# Patient Record
Sex: Female | Born: 1955 | Race: Black or African American | Hispanic: No | Marital: Married | Smoking: Never smoker
Health system: Southern US, Community
[De-identification: ages and names within clinical notes are randomized; demographics above are authoritative.]

## PROBLEM LIST (undated history)

## (undated) ENCOUNTER — Ambulatory Visit: Admission: EM | Disposition: A | Discharge status: Home / Self Care | Payer: Medicare Other

---

## 2004-10-12 ENCOUNTER — Ambulatory Visit: Payer: Self-pay | Admitting: General Practice

## 2005-04-17 ENCOUNTER — Ambulatory Visit: Payer: Self-pay | Admitting: Unknown Physician Specialty

## 2005-10-24 ENCOUNTER — Ambulatory Visit: Payer: Self-pay | Admitting: General Practice

## 2006-12-06 ENCOUNTER — Ambulatory Visit: Payer: Self-pay | Admitting: Endocrinology

## 2007-07-26 ENCOUNTER — Ambulatory Visit: Payer: Self-pay | Admitting: Rheumatology

## 2007-07-30 ENCOUNTER — Ambulatory Visit: Payer: Self-pay | Admitting: Rheumatology

## 2007-08-16 ENCOUNTER — Ambulatory Visit: Payer: Self-pay | Admitting: Orthopaedic Surgery

## 2007-08-23 ENCOUNTER — Ambulatory Visit: Payer: Self-pay | Admitting: Orthopaedic Surgery

## 2007-12-12 ENCOUNTER — Ambulatory Visit: Payer: Self-pay | Admitting: Anesthesiology

## 2008-03-30 ENCOUNTER — Ambulatory Visit: Payer: Self-pay | Admitting: Otolaryngology

## 2008-10-09 DIAGNOSIS — E7800 Pure hypercholesterolemia, unspecified: Secondary | ICD-10-CM | POA: Insufficient documentation

## 2008-11-09 DIAGNOSIS — M1710 Unilateral primary osteoarthritis, unspecified knee: Secondary | ICD-10-CM | POA: Insufficient documentation

## 2009-03-31 ENCOUNTER — Ambulatory Visit: Payer: Self-pay

## 2009-04-05 DIAGNOSIS — I10 Essential (primary) hypertension: Secondary | ICD-10-CM | POA: Insufficient documentation

## 2010-06-16 IMAGING — US US THYROID
1 series · 17 of 25 positions shown · non-contrast
Comparison: none

REASON FOR EXAM: Thyroid nodules, hypothyroidism
COMMENTS:

[Series 1: us thyroid · 17 of 33 slices shown]
[im 1/33]
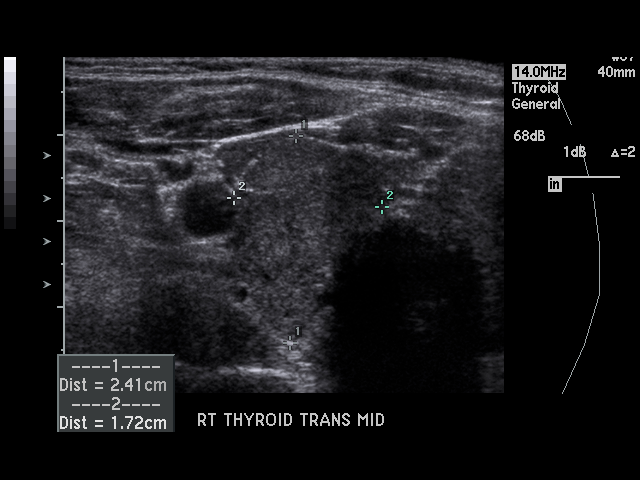
[im 3/33]
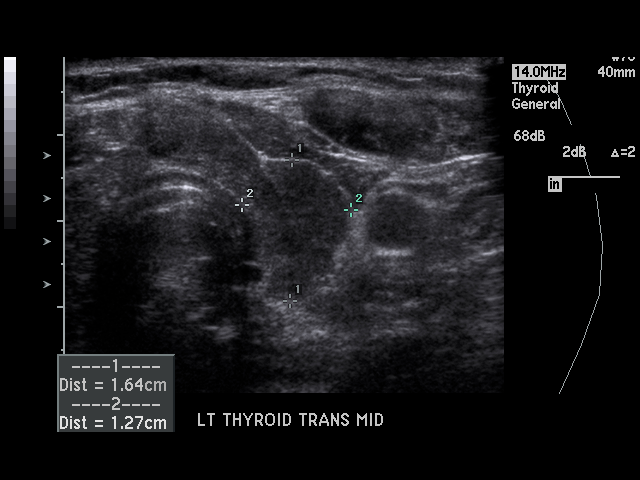
[im 5/33]
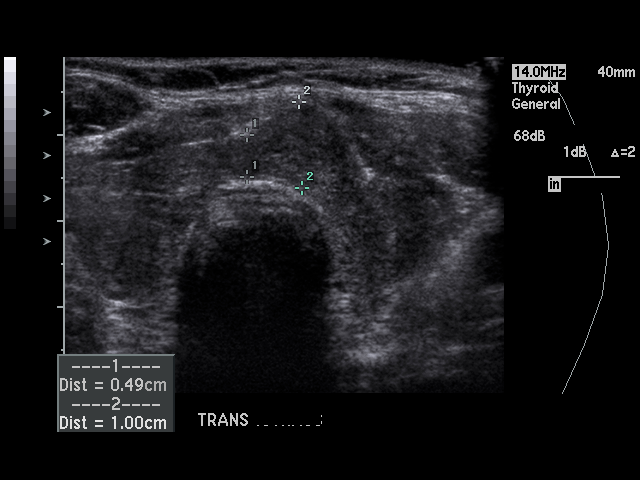
[im 7/33]
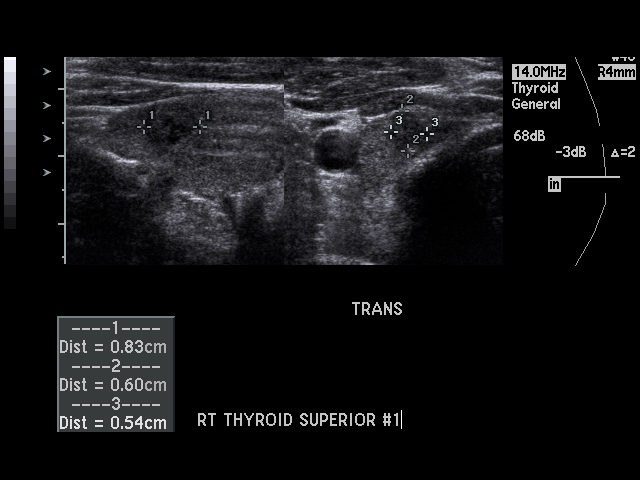
[im 9/33]
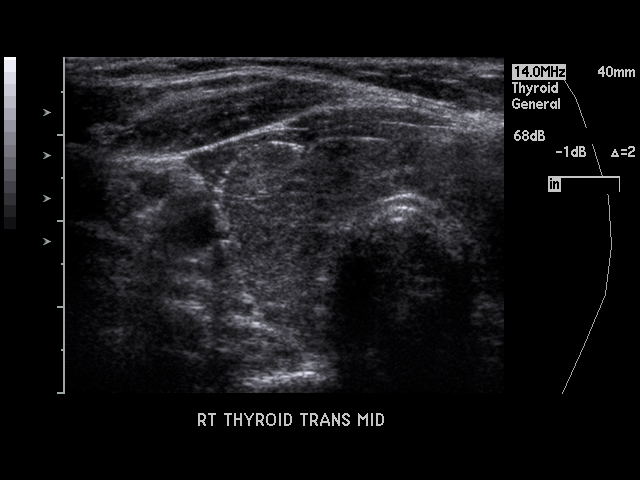
[im 11/33]
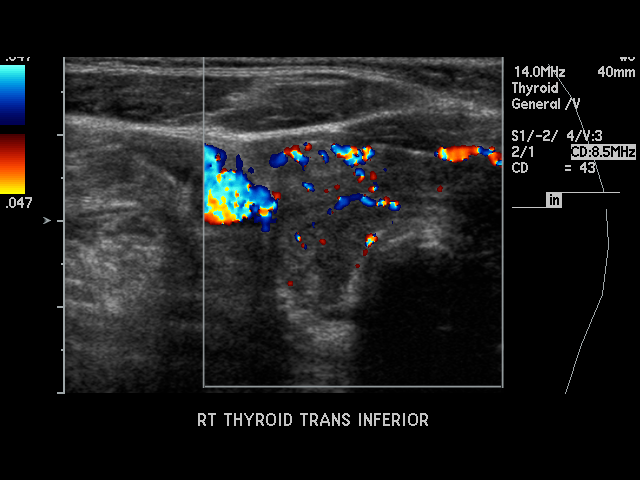
[im 13/33]
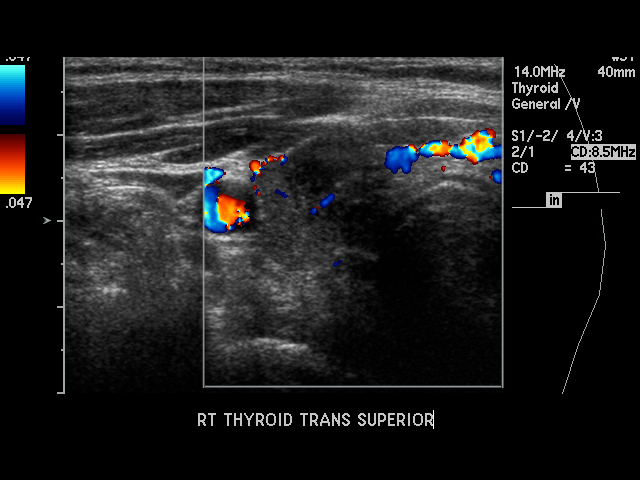
[im 15/33]
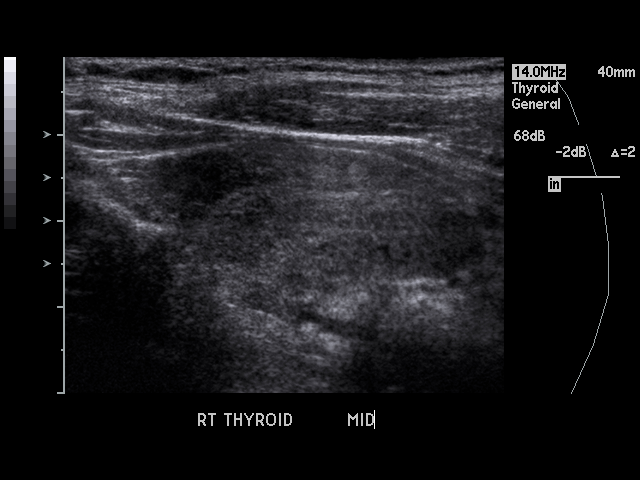
[im 17/33]
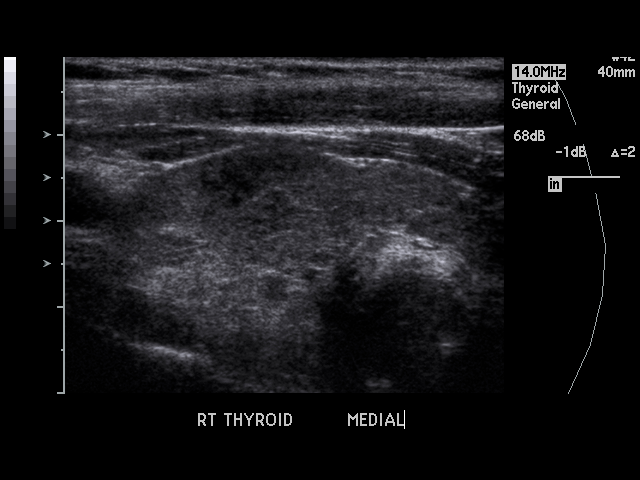
[im 18/33]
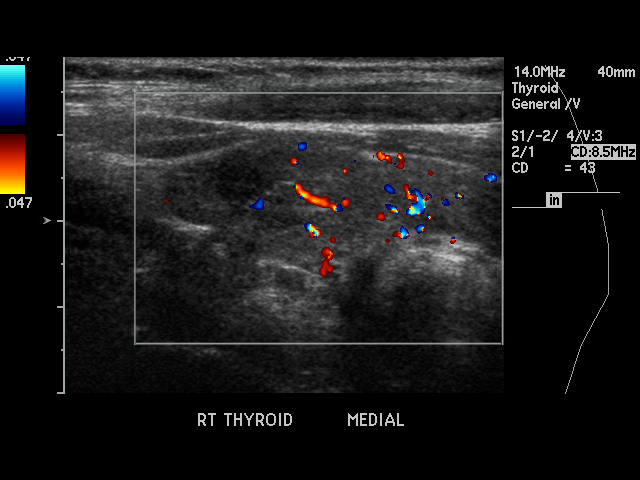
[im 21/33]
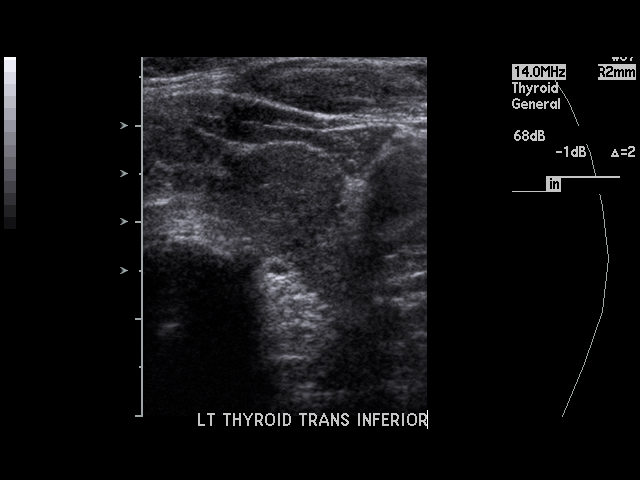
[im 22/33]
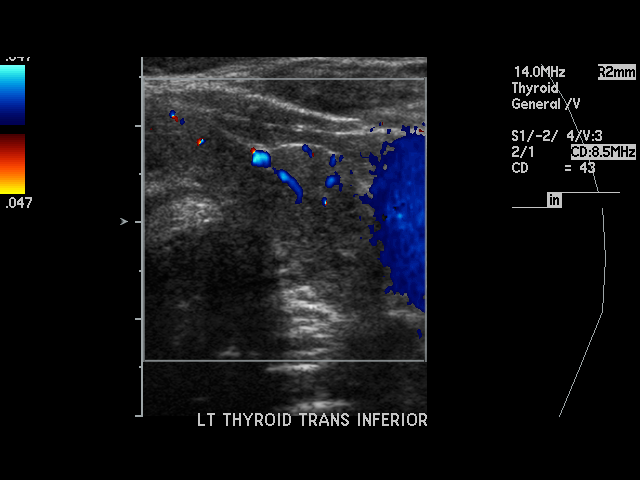
[im 25/33]
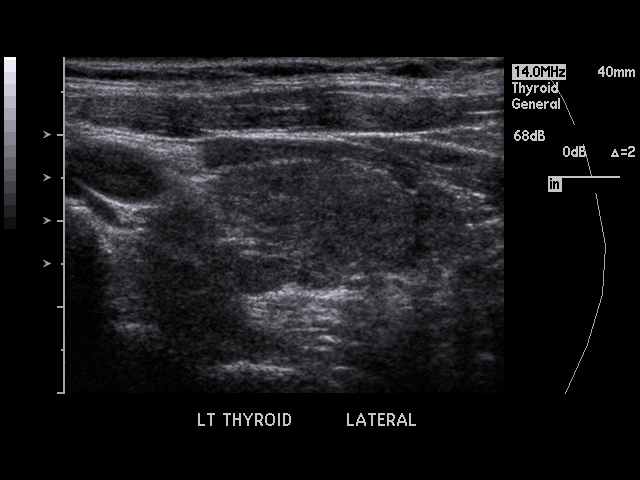
[im 26/33]
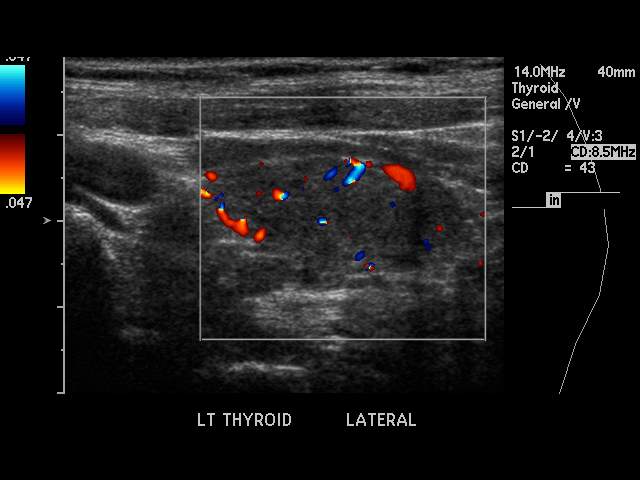
[im 29/33]
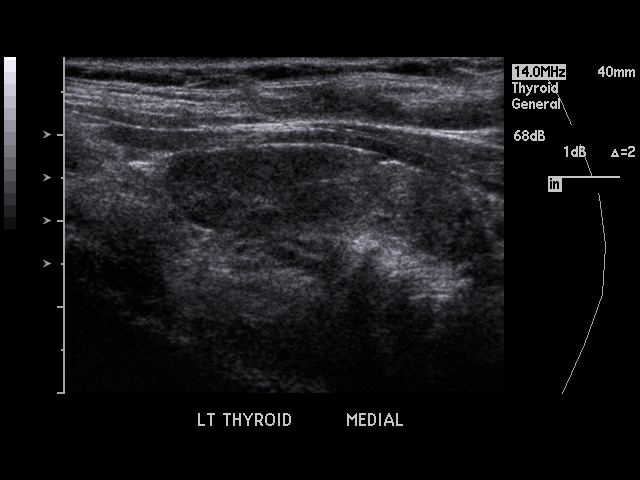
[im 30/33]
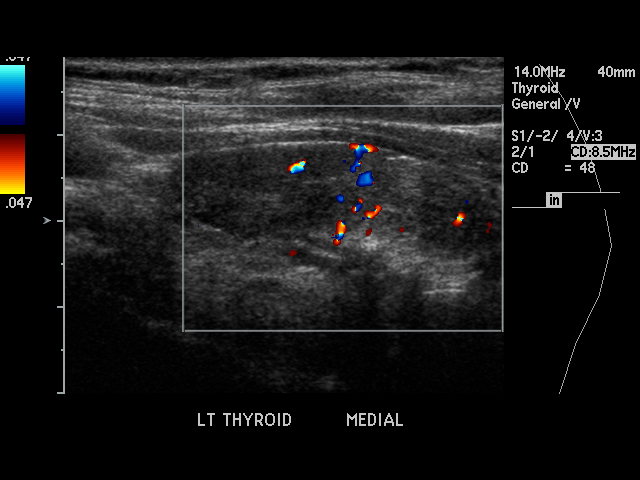
[im 33/33]
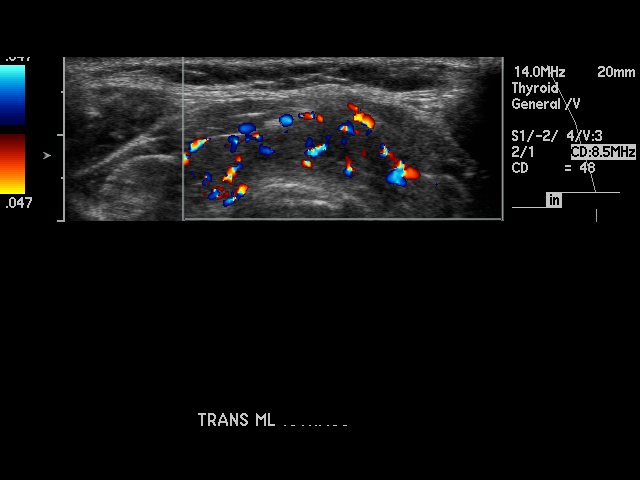

[17 of 25 positions shown; findings below may reference images not displayed]

PROCEDURE:     US  - US THYROID  - March 30, 2008 [DATE]

RESULT:     The RIGHT lobe of the thyroid measures 3.86 cm x 1.72 cm x
cm. The LEFT lobe measures 3.95 cm x 1.27 cm x 1.64 cm. There is an 8.3 mm,
hypoechoic nodule at the upper pole of the RIGHT lobe. No associated
calcifications are seen. No focal nodules are identified in the LEFT lobe.
In the lateral aspect of the isthmus on the LEFT there is noted thickening
of the isthmus which increases from approximately 5.0 mm in AP diameter to
1.0 cm in AP diameter. The echo pattern is similar to that of the thyroid.
No definite focal mass is seen. The lobes of the thyroid bilaterally have a
mildly heterogeneous echotexture.
IMPRESSION: 1.  There is 8.3 mm, hypoechoic nodule in the RIGHT lobe.
2.  No definite nodules are identified on the LEFT. There is noted
thickening of the isthmus on the LEFT but a definite mass is not seen.
3.  The thyroid echotexture bilaterally is mildly heterogeneous.

## 2010-06-21 ENCOUNTER — Ambulatory Visit: Payer: Self-pay

## 2011-08-25 ENCOUNTER — Ambulatory Visit: Payer: Self-pay

## 2012-12-04 ENCOUNTER — Emergency Department: Payer: Self-pay | Admitting: Emergency Medicine

## 2012-12-04 LAB — COMPREHENSIVE METABOLIC PANEL
Alkaline Phosphatase: 91 U/L (ref 50–136)
Anion Gap: 5 — ABNORMAL LOW (ref 7–16)
BUN: 14 mg/dL (ref 7–18)
Bilirubin,Total: 0.5 mg/dL (ref 0.2–1.0)
Chloride: 97 mmol/L — ABNORMAL LOW (ref 98–107)
Co2: 34 mmol/L — ABNORMAL HIGH (ref 21–32)
Creatinine: 0.77 mg/dL (ref 0.60–1.30)
EGFR (African American): >60
EGFR (Non-African Amer.): >60
Potassium: 2.9 mmol/L — ABNORMAL LOW (ref 3.5–5.1)
SGOT(AST): 24 U/L (ref 15–37)
Sodium: 136 mmol/L (ref 136–145)
Total Protein: 8.5 g/dL — ABNORMAL HIGH (ref 6.4–8.2)

## 2012-12-04 LAB — URINALYSIS, COMPLETE
Blood: NEGATIVE
Ketone: NEGATIVE
Nitrite: NEGATIVE
Ph: 9 (ref 4.5–8.0)
Protein: 30
RBC,UR: <1 /HPF (ref 0–5)
Squamous Epithelial: 3
WBC UR: 1 /HPF (ref 0–5)

## 2012-12-04 LAB — CBC
MCH: 30.7 pg (ref 26.0–34.0)
MCV: 89 fL (ref 80–100)
Platelet: 506 10*3/uL — ABNORMAL HIGH (ref 150–440)
RDW: 13.3 % (ref 11.5–14.5)
WBC: 7.4 10*3/uL (ref 3.6–11.0)

## 2014-02-20 DIAGNOSIS — Z0000 Encounter for general adult medical examination without abnormal findings: Secondary | ICD-10-CM | POA: Insufficient documentation

## 2014-07-31 ENCOUNTER — Ambulatory Visit
Admit: 2014-07-31 | Disposition: A | Discharge status: Home / Self Care | Payer: Self-pay | Attending: Family Medicine | Admitting: Family Medicine

## 2014-11-25 DIAGNOSIS — E559 Vitamin D deficiency, unspecified: Secondary | ICD-10-CM | POA: Insufficient documentation

## 2015-11-19 DIAGNOSIS — M545 Low back pain, unspecified: Secondary | ICD-10-CM | POA: Insufficient documentation

## 2018-10-22 DIAGNOSIS — E039 Hypothyroidism, unspecified: Secondary | ICD-10-CM | POA: Insufficient documentation

## 2019-07-15 DIAGNOSIS — R5383 Other fatigue: Secondary | ICD-10-CM | POA: Insufficient documentation

## 2019-10-16 ENCOUNTER — Ambulatory Visit: Payer: Self-pay | Admitting: Dermatology

## 2020-01-01 DIAGNOSIS — R413 Other amnesia: Secondary | ICD-10-CM | POA: Insufficient documentation

## 2020-04-05 ENCOUNTER — Ambulatory Visit (INDEPENDENT_AMBULATORY_CARE_PROVIDER_SITE_OTHER): Payer: Medicare Other | Admitting: Dermatology

## 2020-04-05 ENCOUNTER — Other Ambulatory Visit: Payer: Self-pay

## 2020-04-05 DIAGNOSIS — L821 Other seborrheic keratosis: Secondary | ICD-10-CM | POA: Diagnosis not present

## 2020-04-05 DIAGNOSIS — L820 Inflamed seborrheic keratosis: Secondary | ICD-10-CM

## 2020-04-05 DIAGNOSIS — D229 Melanocytic nevi, unspecified: Secondary | ICD-10-CM

## 2020-04-05 DIAGNOSIS — D225 Melanocytic nevi of trunk: Secondary | ICD-10-CM | POA: Diagnosis not present

## 2020-04-05 MED ORDER — UREA 39 % EX CREA: TOPICAL_CREAM | CUTANEOUS | 0 refills | Status: AC

## 2020-04-05 NOTE — Progress Notes (Signed)
   Follow-Up Visit   Subjective  Felicia Adams is a 64 y.o. female who presents for the following: multiple lesions to be checked (on the R heel, back, and L neck - irregular appearing ) and ISK (Treated previously with LN2 - patient would like lesion checked for recurrence).  The following portions of the chart were reviewed this encounter and updated as appropriate:   Allergies  Meds  Problems  Med Hx  Surg Hx  Fam Hx     Review of Systems:  No other skin or systemic complaints except as noted in HPI or Assessment and Plan.  Objective  Well appearing patient in no apparent distress; mood and affect are within normal limits.  A focused examination was performed including the trunk and extremities. Relevant physical exam findings are noted in the Assessment and Plan.  Objective  L clavicle: Flesh colored papule   Objective  Face: Stuck-on, waxy, tan-brown papule or plaque --Discussed benign etiology and prognosis.   Objective  L waist: Erythematous keratotic or waxy stuck-on papule or plaque.   Assessment & Plan  Nevus L clavicle Benign appearing, observe.  Seborrheic keratosis Face Multiple, confluent Start Urea cream apply to aa's face QD.   Urea 39 % CREA - Face  Inflamed seborrheic keratosis L waist Discussed hypopigmentation after tx with Ln2.   Destruction of lesion - L waist Complexity: simple   Destruction method: cryotherapy   Informed consent: discussed and consent obtained   Timeout:  patient name, date of birth, surgical site, and procedure verified Lesion destroyed using liquid nitrogen: Yes   Region frozen until ice ball extended beyond lesion: Yes   Outcome: patient tolerated procedure well with no complications   Post-procedure details: wound care instructions given     Seborrheic Keratoses - Stuck-on, waxy, tan-brown papules and plaques  - Discussed benign etiology and prognosis. - Observe - Call for any changes - Discussed urea  cream for the SK's on the face  Return if symptoms worsen or fail to improve - patient RTC if ISK not resolved in 2 months.  Luther Redo, CMA, am acting as scribe for Sarina Ser, MD .  Documentation: I have reviewed the above documentation for accuracy and completeness, and I agree with the above.  Sarina Ser, MD

## 2020-04-08 ENCOUNTER — Encounter: Payer: Self-pay | Admitting: Dermatology

## 2020-11-16 DIAGNOSIS — J302 Other seasonal allergic rhinitis: Secondary | ICD-10-CM | POA: Insufficient documentation

## 2021-06-12 DIAGNOSIS — R2242 Localized swelling, mass and lump, left lower limb: Secondary | ICD-10-CM | POA: Insufficient documentation

## 2022-02-23 DIAGNOSIS — M79671 Pain in right foot: Secondary | ICD-10-CM | POA: Insufficient documentation

## 2022-02-23 DIAGNOSIS — S0033XA Contusion of nose, initial encounter: Secondary | ICD-10-CM | POA: Insufficient documentation

## 2022-06-09 DIAGNOSIS — R0981 Nasal congestion: Secondary | ICD-10-CM | POA: Insufficient documentation

## 2022-07-21 DIAGNOSIS — J3489 Other specified disorders of nose and nasal sinuses: Secondary | ICD-10-CM | POA: Insufficient documentation

## 2022-12-01 DIAGNOSIS — J310 Chronic rhinitis: Secondary | ICD-10-CM | POA: Insufficient documentation

## 2022-12-26 ENCOUNTER — Ambulatory Visit
Admission: EM | Admit: 2022-12-26 | Discharge: 2022-12-26 | Disposition: A | Discharge status: Home / Self Care | Payer: Medicare Other | Attending: Family Medicine | Admitting: Family Medicine

## 2022-12-26 DIAGNOSIS — Z20822 Contact with and (suspected) exposure to covid-19: Secondary | ICD-10-CM | POA: Diagnosis not present

## 2022-12-26 DIAGNOSIS — Z1152 Encounter for screening for COVID-19: Secondary | ICD-10-CM | POA: Insufficient documentation

## 2022-12-26 DIAGNOSIS — B9789 Other viral agents as the cause of diseases classified elsewhere: Secondary | ICD-10-CM | POA: Insufficient documentation

## 2022-12-26 DIAGNOSIS — J22 Unspecified acute lower respiratory infection: Secondary | ICD-10-CM | POA: Diagnosis present

## 2022-12-26 MED ORDER — PROMETHAZINE-DM 6.25-15 MG/5ML PO SYRP: 5.0000 mL | ORAL_SOLUTION | Freq: Three times a day (TID) | ORAL | 0 refills | Status: DC | PRN

## 2022-12-26 MED ORDER — ALBUTEROL SULFATE HFA 108 (90 BASE) MCG/ACT IN AERS
2.0000 | INHALATION_SPRAY | Freq: Four times a day (QID) | RESPIRATORY_TRACT | Status: DC
  Administered 2022-12-26: 2 via RESPIRATORY_TRACT

## 2022-12-26 MED ORDER — ALBUTEROL SULFATE HFA 108 (90 BASE) MCG/ACT IN AERS: 2.0000 | INHALATION_SPRAY | Freq: Four times a day (QID) | RESPIRATORY_TRACT | Status: AC | PRN

## 2022-12-26 MED ORDER — PREDNISONE 20 MG PO TABS: 20.0000 mg | ORAL_TABLET | Freq: Every day | ORAL | 0 refills | Status: AC

## 2022-12-26 NOTE — Discharge Instructions (Signed)
COVID test results will be available within 24 hours.  You can either activate your MyChart or call our office after 2:00 tomorrow p.m. to dad about results.  In the meantime continue treatment as prescribed.  Your COVID test is positive you will qualify for Paxlovid antivirals.  Medication can shorten the course of COVID along with decrease complications associated with COVID infections.

## 2022-12-26 NOTE — ED Provider Notes (Signed)
[facility]    CSN: [id number] Arrival date & time: 12/26/22  0802      History   Chief Complaint Chief Complaint  Patient presents with   Cough   Headache    HPI Felicia Adams is a 67 y.o. female, patient presents today with cough, congestion, headaches, and generally feeling unwell. She has not had fever. Endorses back pain in middle of back with breathing. Wears mask however, unknown of any sick contacts. Patient has a history chronic rhinitis and and seasonal allergies and takes medications for prevention of these symptoms. Patient denies shortness of breath, wheezing, or chest tightness or pain.   History reviewed. No pertinent past medical history.  Patient Active Problem List   Diagnosis Date Noted   Chronic rhinitis 12/01/2022   Nasal obstruction 07/21/2022   Chronic nasal congestion 06/09/2022   Foot pain, right 02/23/2022   Traumatic ecchymosis of nose 02/23/2022   Mass of left thigh 06/12/2021   Seasonal allergies 11/16/2020   Memory change 01/01/2020   Fatigue 07/15/2019   Hypothyroidism (acquired) 10/22/2018   Low back pain 11/19/2015   Vitamin D deficiency 11/25/2014   Preventative health care 02/20/2014   Hypertension, benign 04/05/2009   Primary localized osteoarthrosis, lower leg 11/09/2008   Hypercholesterolemia 10/09/2008    History reviewed. No pertinent surgical history.  OB History   No obstetric history on file.      Home Medications    Prior to Admission medications   Medication Sig Start Date End Date Taking? Authorizing Provider  albuterol (VENTOLIN HFA) 108 (90 Base) MCG/ACT inhaler Inhale 2 puffs into the lungs every 6 (six) hours as needed for wheezing or shortness of breath. 12/26/22  Yes Bing Neighbors, NP  mirtazapine (REMERON) 15 MG tablet Take by mouth. 04/09/22 04/09/23 Yes [provider]  predniSONE (DELTASONE) 20 MG tablet Take 1 tablet (20 mg total) by mouth daily with breakfast for 5 days.  12/26/22 12/31/22 Yes Bing Neighbors, NP  promethazine-dextromethorphan (PROMETHAZINE-DM) 6.25-15 MG/5ML syrup Take 5 mLs by mouth 3 (three) times daily as needed for cough. 12/26/22  Yes Bing Neighbors, NP  sertraline (ZOLOFT) 100 MG tablet Take 1 tablet by mouth daily. 08/25/22  Yes [provider]  traMADol (ULTRAM) 50 MG tablet Take 50 mg by mouth every 6 (six) hours as needed. 09/19/22  Yes [provider]  Cholecalciferol 50 MCG (2000 UT) TABS Take by mouth. 09/25/19   [provider]  cyanocobalamin (VITAMIN B12) 1000 MCG tablet Take 1 tablet by mouth daily.    [provider]  fluticasone (FLONASE) 50 MCG/ACT nasal spray 2 sprays by Each Nare route daily. 02/25/19   [provider]  hydrochlorothiazide (HYDRODIURIL) 25 MG tablet TAKE ONE TABLET BYM OUTH ONCE DAILY IN THE MORNING 02/18/19   [provider]  meloxicam (MOBIC) 15 MG tablet Take by mouth. 09/03/19   [provider]  montelukast (SINGULAIR) 10 MG tablet Take 10 mg by mouth daily.    [provider]  rosuvastatin (CRESTOR) 40 MG tablet Take 1 tablet by mouth daily. 03/05/20   [provider]  Urea 39 % CREA Apply to aa's face QD PRN. 04/05/20   Deirdre Evener, MD    Family History History reviewed. No pertinent family history.  Social History Social History   Tobacco Use   Smoking status: Never   Smokeless tobacco: Never  Vaping Use   Vaping status: Never Used  Substance Use Topics   Alcohol  use: Never   Drug use: Never     Allergies   Oxycodone   Review of Systems Review of Systems  Respiratory:  Positive for cough.   Neurological:  Positive for headaches.     Physical Exam Triage Vital Signs ED Triage Vitals [12/26/22 0819]  Encounter Vitals Group     BP (!) 158/88     Systolic BP Percentile      Diastolic BP Percentile      Pulse Rate 67     Resp 18     Temp 98.4 F (36.9 C)     Temp Source Oral     SpO2 96 %      Weight      Height      Head Circumference      Peak Flow      Pain Score      Pain Loc      Pain Education      Exclude from Growth Chart    No data found.  Updated Vital Signs BP (!) 158/88 (BP Location: Left Arm)   Pulse 67   Temp 98.4 F (36.9 C) (Oral)   Resp 18   SpO2 96%   Visual Acuity Right Eye Distance:   Left Eye Distance:   Bilateral Distance:    Right Eye Near:   Left Eye Near:    Bilateral Near:     Physical Exam Vitals reviewed.  Constitutional:      Appearance: She is well-developed.  HENT:     Head: Normocephalic and atraumatic.     Nose: Mucosal edema, congestion and rhinorrhea present.  Eyes:     Extraocular Movements: Extraocular movements intact.     Pupils: Pupils are equal, round, and reactive to light.  Cardiovascular:     Rate and Rhythm: Normal rate and regular rhythm.  Pulmonary:     Effort: Pulmonary effort is normal.     Breath sounds: Rhonchi present.  Musculoskeletal:        General: Normal range of motion.  Skin:    General: Skin is warm and dry.  Neurological:     Mental Status: She is alert.      UC Treatments / Results  Labs (all labs ordered are listed, but only abnormal results are displayed) Labs Reviewed  SARS CORONAVIRUS 2 (TAT 6-24 HRS)    EKG   Radiology No results found.  Procedures Procedures (including critical care time)  Medications Ordered in UC Medications  albuterol (VENTOLIN HFA) 108 (90 Base) MCG/ACT inhaler 2 puff (has no administration in time range)    Initial Impression / Assessment and Plan / UC Course  I have reviewed the triage vital signs and the nursing notes.  Pertinent labs & imaging results that were available during my care of the patient were reviewed by me and considered in my medical decision making (see chart for details).    Viral illness, obtained a COVID PCR test to rule out COVID as a source of symptoms.  Will treat with prednisone 20 mg once daily for 5 days.   Also given notable rhonchi and chest will cover with prednisone 20 mg daily for 5 days to relieve chest congestion as this is concerning for possibly evolving bronchitis.  Promethazine DM for cough.  Patient advised that if COVID test is positive she qualifies for antivirals.  Patient has had recent blood work and GFR is greater than 60. Final Clinical Impressions(s) / UC Diagnoses   Final diagnoses:  Encounter  for laboratory testing for COVID-19 virus  Viral infection of lower respiratory system     Discharge Instructions      COVID test results will be available within 24 hours.  You can either activate your MyChart or call our office after 2:00 tomorrow p.m. to dad about results.  In the meantime continue treatment as prescribed.  Your COVID test is positive you will qualify for Paxlovid antivirals.  Medication can shorten the course of COVID along with decrease complications associated with COVID infections.     ED Prescriptions     Medication Sig Dispense Auth. Provider   predniSONE (DELTASONE) 20 MG tablet Take 1 tablet (20 mg total) by mouth daily with breakfast for 5 days. 5 tablet Bing Neighbors, NP   promethazine-dextromethorphan (PROMETHAZINE-DM) 6.25-15 MG/5ML syrup Take 5 mLs by mouth 3 (three) times daily as needed for cough. 180 mL Bing Neighbors, NP   albuterol (VENTOLIN HFA) 108 (90 Base) MCG/ACT inhaler Inhale 2 puffs into the lungs every 6 (six) hours as needed for wheezing or shortness of breath. -- Bing Neighbors, NP      PDMP not reviewed this encounter.   Bing Neighbors, NP 12/26/22 [phone]

## 2022-12-26 NOTE — ED Triage Notes (Signed)
Patient presents to UC for HA and productive cough since yesterday. Not taking any OTC meds for symptoms.

## 2022-12-27 LAB — SARS CORONAVIRUS 2 (TAT 6-24 HRS): SARS Coronavirus 2: NEGATIVE

## 2022-12-28 ENCOUNTER — Telehealth: Payer: Self-pay

## 2022-12-28 NOTE — Telephone Encounter (Signed)
Patient contacted Urgent Care for Covid testing results.   Provided with results; no further questions.

## 2023-03-02 ENCOUNTER — Emergency Department
Admission: EM | Admit: 2023-03-02 | Discharge: 2023-03-02 | Disposition: A | Discharge status: Against Medical Advice | Payer: Medicare Other | Attending: Emergency Medicine | Admitting: Emergency Medicine

## 2023-03-02 ENCOUNTER — Other Ambulatory Visit: Payer: Self-pay

## 2023-03-02 ENCOUNTER — Ambulatory Visit
Admission: EM | Admit: 2023-03-02 | Discharge: 2023-03-02 | Disposition: A | Discharge status: Home / Self Care | Payer: Medicare Other | Source: Home / Self Care | Attending: Family Medicine | Admitting: Family Medicine

## 2023-03-02 DIAGNOSIS — M6281 Muscle weakness (generalized): Secondary | ICD-10-CM | POA: Insufficient documentation

## 2023-03-02 DIAGNOSIS — Z1152 Encounter for screening for COVID-19: Secondary | ICD-10-CM | POA: Insufficient documentation

## 2023-03-02 DIAGNOSIS — K219 Gastro-esophageal reflux disease without esophagitis: Secondary | ICD-10-CM | POA: Insufficient documentation

## 2023-03-02 DIAGNOSIS — R42 Dizziness and giddiness: Secondary | ICD-10-CM

## 2023-03-02 DIAGNOSIS — Z5321 Procedure and treatment not carried out due to patient leaving prior to being seen by health care provider: Secondary | ICD-10-CM | POA: Insufficient documentation

## 2023-03-02 DIAGNOSIS — D649 Anemia, unspecified: Secondary | ICD-10-CM | POA: Insufficient documentation

## 2023-03-02 DIAGNOSIS — I10 Essential (primary) hypertension: Secondary | ICD-10-CM | POA: Insufficient documentation

## 2023-03-02 DIAGNOSIS — R9431 Abnormal electrocardiogram [ECG] [EKG]: Secondary | ICD-10-CM | POA: Insufficient documentation

## 2023-03-02 DIAGNOSIS — R112 Nausea with vomiting, unspecified: Secondary | ICD-10-CM | POA: Insufficient documentation

## 2023-03-02 DIAGNOSIS — E785 Hyperlipidemia, unspecified: Secondary | ICD-10-CM | POA: Insufficient documentation

## 2023-03-02 LAB — URINALYSIS, ROUTINE W REFLEX MICROSCOPIC
Bilirubin Urine: NEGATIVE
Glucose, UA: NEGATIVE mg/dL
Hgb urine dipstick: NEGATIVE
Ketones, ur: NEGATIVE mg/dL
Leukocytes,Ua: NEGATIVE
Nitrite: NEGATIVE
Protein, ur: NEGATIVE mg/dL
Specific Gravity, Urine: 1.018 (ref 1.005–1.030)
pH: 5 (ref 5.0–8.0)

## 2023-03-02 LAB — CBC
HCT: 40.7 % (ref 36.0–46.0)
Hemoglobin: 13.1 g/dL (ref 12.0–15.0)
MCH: 29.4 pg (ref 26.0–34.0)
MCHC: 32.2 g/dL (ref 30.0–36.0)
MCV: 91.3 fL (ref 80.0–100.0)
Platelets: 272 10*3/uL (ref 150–400)
RBC: 4.46 MIL/uL (ref 3.87–5.11)
RDW: 13.2 % (ref 11.5–15.5)
WBC: 7.8 10*3/uL (ref 4.0–10.5)
nRBC: 0 % (ref 0.0–0.2)

## 2023-03-02 LAB — BASIC METABOLIC PANEL
Anion gap: 11 (ref 5–15)
BUN: 16 mg/dL (ref 8–23)
CO2: 26 mmol/L (ref 22–32)
Calcium: 9.6 mg/dL (ref 8.9–10.3)
Chloride: 99 mmol/L (ref 98–111)
Creatinine, Ser: 0.84 mg/dL (ref 0.44–1.00)
GFR, Estimated: >60 mL/min (ref >60)
Glucose, Bld: 151 mg/dL — ABNORMAL HIGH (ref 70–99)
Potassium: 3.6 mmol/L (ref 3.5–5.1)
Sodium: 136 mmol/L (ref 135–145)

## 2023-03-02 LAB — SARS CORONAVIRUS 2 BY RT PCR: SARS Coronavirus 2 by RT PCR: NEGATIVE

## 2023-03-02 NOTE — ED Provider Notes (Signed)
[facility]    CSN: [id number] Arrival date & time: 03/02/23  1726      History   Chief Complaint Chief Complaint  Patient presents with   Dizziness    HPI VIANEY CANIGLIA is a 67 y.o. female.   HPI   Ashritha presents for dizziness started this morning after waking up and rolling over. No headache, chest pain, fever, leg swelling.  Had dizziness years ago but is unsure what it was for. Daughter states her mom has vertigo.    Told her blood pressure medications today.    Fever : no  Chills: no Sore throat: no   Cough: no Sputum: no Nasal congestion : no  Rhinorrhea: no Myalgias: no Appetite: normal  Hydration: normal  Abdominal pain: no Nausea: no Vomiting: no Sleep disturbance: no Headache: no      History reviewed. No pertinent past medical history.  Patient Active Problem List   Diagnosis Date Noted   Chronic rhinitis 12/01/2022   Nasal obstruction 07/21/2022   Chronic nasal congestion 06/09/2022   Foot pain, right 02/23/2022   Traumatic ecchymosis of nose 02/23/2022   Mass of left thigh 06/12/2021   Seasonal allergies 11/16/2020   Memory change 01/01/2020   Fatigue 07/15/2019   Hypothyroidism (acquired) 10/22/2018   Low back pain 11/19/2015   Vitamin D deficiency 11/25/2014   Preventative health care 02/20/2014   Hypertension, benign 04/05/2009   Primary localized osteoarthrosis, lower leg 11/09/2008   Hypercholesterolemia 10/09/2008    History reviewed. No pertinent surgical history.  OB History   No obstetric history on file.      Home Medications    Prior to Admission medications   Medication Sig Start Date End Date Taking? Authorizing Provider  albuterol (VENTOLIN HFA) 108 (90 Base) MCG/ACT inhaler Inhale 2 puffs into the lungs every 6 (six) hours as needed for wheezing or shortness of breath. 12/26/22  Yes Bing Neighbors, NP  Cholecalciferol 50 MCG (2000 UT) TABS Take by mouth. 09/25/19  Yes [provider]  cyanocobalamin (VITAMIN B12) 1000 MCG tablet Take 1 tablet by mouth daily.   Yes [provider]  fluticasone (FLONASE) 50 MCG/ACT nasal spray 2 sprays by Each Nare route daily. 02/25/19  Yes [provider]  hydrochlorothiazide (HYDRODIURIL) 25 MG tablet TAKE ONE TABLET BYM OUTH ONCE DAILY IN THE MORNING 02/18/19  Yes [provider]  meloxicam (MOBIC) 15 MG tablet Take by mouth. 09/03/19  Yes [provider]  mirtazapine (REMERON) 15 MG tablet Take by mouth. 04/09/22 04/09/23 Yes [provider]  montelukast (SINGULAIR) 10 MG tablet Take 10 mg by mouth daily.   Yes [provider]  rosuvastatin (CRESTOR) 40 MG tablet Take 1 tablet by mouth daily. 03/05/20  Yes [provider]  sertraline (ZOLOFT) 100 MG tablet Take 1 tablet by mouth daily. 08/25/22  Yes [provider]  traMADol (ULTRAM) 50 MG tablet Take 50 mg by mouth every 6 (six) hours as needed. 09/19/22  Yes [provider]  Urea 39 % CREA Apply to aa's face QD PRN. 04/05/20  Yes Deirdre Evener, MD  promethazine-dextromethorphan (PROMETHAZINE-DM) 6.25-15 MG/5ML syrup Take 5 mLs by mouth 3 (three) times daily as needed for cough. 12/26/22   Bing Neighbors, NP    Family History History reviewed. No pertinent family history.  Social History Social History   Tobacco Use   Smoking status: Never   Smokeless tobacco: Never  Vaping Use   Vaping status: Never  Used  Substance Use Topics   Alcohol use: Never   Drug use: Never     Allergies   Oxycodone   Review of Systems Review of Systems: negative unless otherwise stated in HPI.      Physical Exam Triage Vital Signs ED Triage Vitals  Encounter Vitals Group     BP 03/02/23 1759 (!) 188/81     Systolic BP Percentile --      Diastolic BP Percentile --      Pulse Rate 03/02/23 1759 75     Resp --      Temp 03/02/23 1759 97.8 F (36.6 C)     Temp Source 03/02/23 1759 Oral     SpO2 03/02/23  1759 94 %     Weight 03/02/23 1756 150 lb (68 kg)     Height 03/02/23 1756 5\' 5"  (1.651 m)     Head Circumference --      Peak Flow --      Pain Score 03/02/23 1755 5     Pain Loc --      Pain Education --      Exclude from Growth Chart --    No data found.  Updated Vital Signs BP (!) 188/81 (BP Location: Left Arm)   Pulse 75   Temp 97.8 F (36.6 C) (Oral)   Ht 5\' 5"  (1.651 m)   Wt 68 kg   SpO2 94%   BMI 24.96 kg/m   Visual Acuity Right Eye Distance:   Left Eye Distance:   Bilateral Distance:    Right Eye Near:   Left Eye Near:    Bilateral Near:     Physical Exam GEN:     alert, cooperative and no distress ***   HENT:  mucus membranes moist, oropharyngeal without lesions or erythema ***,  nares patent, no nasal discharge *** EYES:   pupils equal and reactive, EOM intact NECK:  supple, normal ROM, no lymphadenopathy *** RESP:  clear to auscultation bilaterally, no increased work of breathing *** CVS:   regular rate and rhythm, no murmur, distal pulses intact  *** ABD:  soft, non-tender; bowel sounds present; no palpable masses,  *** EXT:   normal ROM, atraumatic, no edema *** NEURO:  normal without focal findings,  speech normal, alert and oriented  *** Skin:   warm and dry, no rash***, normal*** skin turgor Psych: Normal affect, appropriate speech and behavior      UC Treatments / Results  Labs (all labs ordered are listed, but only abnormal results are displayed) Labs Reviewed  SARS CORONAVIRUS 2 BY RT PCR    EKG  If EKG performed, see my interpretation in the MDM section  Radiology No results found.   Procedures Procedures (including critical care time)  Medications Ordered in UC Medications - No data to display  Initial Impression / Assessment and Plan / UC Course  I have reviewed the triage vital signs and the nursing notes.  Pertinent labs & imaging results that were available during my care of the patient were reviewed by me and considered  in my medical decision making (see chart for details).       Patient is a 67 y.o. female  who presents for ***.  Overall patient is nontoxic-appearing and ***afebrile.  Vital signs stable.   ED and return precautions given and patient/guardian voiced understanding. Discussed MDM, treatment plan and plan for follow-up with patient*** who agrees with plan.     Discussed MDM, treatment plan and plan  for follow-up with patient***who agrees with plan.   Final Clinical Impressions(s) / UC Diagnoses   Final diagnoses:  None   Discharge Instructions   None    ED Prescriptions   None    PDMP not reviewed this encounter.

## 2023-03-02 NOTE — Discharge Instructions (Signed)
Your blood pressure is elevated at 188/81 with vomiting and dizziness.  I am concerned that you have something more going on and may need head imaging.    You have been advised to follow up immediately in the emergency department for concerning signs or symptoms as discussed during your visit. If you declined EMS transport, please have a family member take you directly to the ED at this time. Do not delay.   Based on concerns about condition, if you do not follow up in the ED, you may risk poor outcomes including worsening of condition, delayed treatment and potentially life threatening issues. If you have declined to go to the ED at this time, you should call your PCP immediately to set up a follow up appointment

## 2023-03-02 NOTE — ED Triage Notes (Signed)
Pt c/o dizziness, vomiting x1day Pt states that it feels like the room is spinning.   Pt is with her daughter  Pt daughter states that her mother could not get out of bed and could not walk straight.   Pt denies any new foods, activities, or being around anyone sick.   Pt denies any current nausea and states that the vomiting was sudden without nausea.

## 2023-03-02 NOTE — ED Triage Notes (Signed)
First nurse note: Pt here via [facility] with /co of dizziness.   98.7 oral  164/80 HR: 75 98% RA   CBG 107

## 2023-03-02 NOTE — ED Triage Notes (Signed)
Pt presents to ED with c/o of dizziness, pt states HX of vertigo "awhile ago". Pt states no weakness or pain associated with this. NAD noted.

## 2023-03-02 NOTE — ED Notes (Signed)
Patient is being discharged from the Urgent Care and sent to the Emergency Department via Personal Vehicle . Per Dr.Brimage, patient is in need of higher level of care due to Hypertension, Vomiting, and Dizziness. Patient is aware and verbalizes understanding of plan of care.  Vitals:   03/02/23 1759  BP: (!) 188/81  Pulse: 75  Temp: 97.8 F (36.6 C)  SpO2: 94%

## 2024-02-28 ENCOUNTER — Encounter: Payer: Self-pay | Admitting: Emergency Medicine

## 2024-02-28 ENCOUNTER — Ambulatory Visit
Admission: EM | Admit: 2024-02-28 | Discharge: 2024-02-28 | Disposition: A | Discharge status: Home / Self Care | Attending: Emergency Medicine | Admitting: Emergency Medicine

## 2024-02-28 ENCOUNTER — Ambulatory Visit (INDEPENDENT_AMBULATORY_CARE_PROVIDER_SITE_OTHER)

## 2024-02-28 DIAGNOSIS — M25512 Pain in left shoulder: Secondary | ICD-10-CM

## 2024-02-28 DIAGNOSIS — G8929 Other chronic pain: Secondary | ICD-10-CM

## 2024-02-28 MED ORDER — BACLOFEN 5 MG PO TABS: 5.0000 mg | ORAL_TABLET | Freq: Every evening | ORAL | 0 refills | Status: AC | PRN

## 2024-02-28 MED ORDER — PREDNISONE 10 MG (21) PO TBPK: ORAL_TABLET | Freq: Every day | ORAL | 0 refills | Status: AC

## 2024-02-28 NOTE — ED Triage Notes (Signed)
 Patient fell couples months ago. Patient was evaluated but did not have x-rays. Patient has been complaining of pain to left shoulder that radiates down left arm. Patient forearm is tender to touch. Patient has been taking Tylenol with no relief. Rates pain 5/10.

## 2024-02-28 NOTE — ED Provider Notes (Signed)
 Felicia Adams    CSN: [id number] Arrival date & time: 02/28/24  1112      History   Chief Complaint Chief Complaint  Patient presents with   Shoulder Injury   Arm Injury    HPI Felicia Adams is a 68 y.o. female.   Patient presents for evaluation of left shoulder pain present for 5 months following a fall.  Was evaluated by her primary doctor 1 to 2 weeks after injury, endorses no imaging was completed but was advised to complete physical therapy which she did without improvement, did not complete further follow-up.  Pain has been constant radiating intermittent only into the left arm, exacerbated when lifting the arm above head.  Becoming more prominent and progressively worsening.,  Was prescribed medication for treatment but unsure of what medicine was.  Additionally has attempted use of ice heat Tylenol and Motrin without improvement.  Denies presence of numbness or tingling.  History reviewed. No pertinent past medical history.  Patient Active Problem List   Diagnosis Date Noted   Chronic rhinitis 12/01/2022   Nasal obstruction 07/21/2022   Chronic nasal congestion 06/09/2022   Foot pain, right 02/23/2022   Traumatic ecchymosis of nose 02/23/2022   Mass of left thigh 06/12/2021   Seasonal allergies 11/16/2020   Memory change 01/01/2020   Fatigue 07/15/2019   Hypothyroidism (acquired) 10/22/2018   Low back pain 11/19/2015   Vitamin D deficiency 11/25/2014   Preventative health care 02/20/2014   Hypertension, benign 04/05/2009   Primary localized osteoarthrosis, lower leg 11/09/2008   Hypercholesterolemia 10/09/2008    History reviewed. No pertinent surgical history.  OB History   No obstetric history on file.      Home Medications    Prior to Admission medications   Medication Sig Start Date End Date Taking? Authorizing Provider  Baclofen 5 MG TABS Take 1 tablet (5 mg total) by mouth at bedtime as needed. 02/28/24  Yes Montford Barg R, NP   predniSONE  (STERAPRED UNI-PAK 21 TAB) 10 MG (21) TBPK tablet Take by mouth daily. Take 6 tabs by mouth daily  for 1 days, then 5 tabs for 1 days, then 4 tabs for 1 days, then 3 tabs for 1 days, 2 tabs for 1 days, then 1 tab by mouth daily for 1 days 02/28/24  Yes Nadyne Gariepy, Shelba SAUNDERS, NP  albuterol  (VENTOLIN  HFA) 108 (90 Base) MCG/ACT inhaler Inhale 2 puffs into the lungs every 6 (six) hours as needed for wheezing or shortness of breath. 12/26/22   Arloa Suzen RAMAN, NP  Cholecalciferol 50 MCG (2000 UT) TABS Take by mouth. 09/25/19   [provider]  cyanocobalamin (VITAMIN B12) 1000 MCG tablet Take 1 tablet by mouth daily.    [provider]  fluticasone (FLONASE) 50 MCG/ACT nasal spray 2 sprays by Each Nare route daily. 02/25/19   [provider]  hydrochlorothiazide (HYDRODIURIL) 25 MG tablet TAKE ONE TABLET BYM OUTH ONCE DAILY IN THE MORNING 02/18/19   [provider]  meloxicam (MOBIC) 15 MG tablet Take by mouth. 09/03/19   [provider]  mirtazapine (REMERON) 15 MG tablet Take by mouth. 04/09/22 04/09/23  [provider]  montelukast (SINGULAIR) 10 MG tablet Take 10 mg by mouth daily.    [provider]  rosuvastatin (CRESTOR) 40 MG tablet Take 1 tablet by mouth daily. 03/05/20   [provider]  sertraline (ZOLOFT) 100 MG tablet Take 1 tablet by mouth daily. 08/25/22   [provider]  traMADol DANNY)  50 MG tablet Take 50 mg by mouth every 6 (six) hours as needed. 09/19/22   [provider]  Urea  39 % CREA Apply to aa's face QD PRN. 04/05/20   Hester Alm BROCKS, MD    Family History History reviewed. No pertinent family history.  Social History Social History   Tobacco Use   Smoking status: Never   Smokeless tobacco: Never  Vaping Use   Vaping status: Never Used  Substance Use Topics   Alcohol use: Never   Drug use: Never     Allergies   Oxycodone   Review of Systems Review of  Systems   Physical Exam Triage Vital Signs ED Triage Vitals  Encounter Vitals Group     BP 02/28/24 1222 95/61     Girls Systolic BP Percentile --      Girls Diastolic BP Percentile --      Boys Systolic BP Percentile --      Boys Diastolic BP Percentile --      Pulse Rate 02/28/24 1222 97     Resp 02/28/24 1222 18     Temp 02/28/24 1222 97.8 F (36.6 C)     Temp Source 02/28/24 1222 Oral     SpO2 02/28/24 1222 96 %     Weight --      Height --      Head Circumference --      Peak Flow --      Pain Score 02/28/24 1219 5     Pain Loc --      Pain Education --      Exclude from Growth Chart --    No data found.  Updated Vital Signs BP 95/61 (BP Location: Left Arm)   Pulse 97   Temp 97.8 F (36.6 C) (Oral)   Resp 18   SpO2 96%   Visual Acuity Right Eye Distance:   Left Eye Distance:   Bilateral Distance:    Right Eye Near:   Left Eye Near:    Bilateral Near:     Physical Exam Constitutional:      Appearance: Normal appearance.  Eyes:     Extraocular Movements: Extraocular movements intact.  Pulmonary:     Effort: Pulmonary effort is normal.  Musculoskeletal:     Comments: Generalized tenderness to the left shoulder worst to the [facility] joint, moderate to severe swelling over the left clavicle without erythema tenderness or deformity, positive Hawkins sign, 2+ brachial pulse, strength 3 out of 5  Neurological:     Mental Status: She is alert and oriented to person, place, and time.      UC Treatments / Results  Labs (all labs ordered are listed, but only abnormal results are displayed) Labs Reviewed - No data to display  EKG   Radiology DG Clavicle Left Result Date: 02/28/2024 CLINICAL DATA:  Left clavicle pain 2 months after fall. EXAM: LEFT CLAVICLE - 2+ VIEWS COMPARISON:  None Available. FINDINGS: No evidence of acute fracture or dislocation. Minimal degenerative change over the left AC joint. Remainder the exam is unremarkable. IMPRESSION: 1. No  acute findings. 2. Minimal degenerative change over the left AC joint. Electronically Signed   By: Toribio Agreste M.D.   On: 02/28/2024 13:45   DG Shoulder Left Result Date: 02/28/2024 CLINICAL DATA:  Left clavicle and shoulder pain 2 months after fall. EXAM: LEFT SHOULDER - 2+ VIEW COMPARISON:  None Available. FINDINGS: There is no evidence of fracture or dislocation. There is no evidence of arthropathy  or other focal bone abnormality. Soft tissues are unremarkable. IMPRESSION: Negative. Electronically Signed   By: Toribio Agreste M.D.   On: 02/28/2024 13:45    Procedures Procedures (including critical care time)  Medications Ordered in UC Medications - No data to display  Initial Impression / Assessment and Plan / UC Course  I have reviewed the triage vital signs and the nursing notes.  Pertinent labs & imaging results that were available during my care of the patient were reviewed by me and considered in my medical decision making (see chart for details).  Chronic left shoulder pain  X-ray showing degenerative changes, reported to daughter via telephone, 2 patient identifiers used, prescribed prednisone  and muscle relaxant for treatment, recommended supportive care through [facility], heat massage stretching with activity as tolerated advised follow-up with primary doctor or orthopedic specialist, walker referral given Final Clinical Impressions(s) / UC Diagnoses   Final diagnoses:  Chronic left shoulder pain     Discharge Instructions      X-rays pending and you will be notified of the results via telephone  Begin prednisone  every morning with food to help reduce inflammation and pain, avoid ibuprofen while taking but may use Tylenol or any topical medicine  May use muscle relaxant at bedtime as needed for comfort, please be mindful this make you feel drowsy  You may use heating pad in 15 minute intervals as needed for additional comfort,or  you may find comfort in using ice in 10-15  minutes over affected area  Begin stretching affected area daily for 10 minutes as tolerated to further loosen muscles   When lying down place pillow underneath the arm and behind back  Please schedule follow-up appoint with your primary doctor or orthopedic specialist listed in front page     ED Prescriptions     Medication Sig Dispense Auth. Provider   predniSONE  (STERAPRED UNI-PAK 21 TAB) 10 MG (21) TBPK tablet Take by mouth daily. Take 6 tabs by mouth daily  for 1 days, then 5 tabs for 1 days, then 4 tabs for 1 days, then 3 tabs for 1 days, 2 tabs for 1 days, then 1 tab by mouth daily for 1 days 21 tablet Haiden Rawlinson R, NP   Baclofen 5 MG TABS Take 1 tablet (5 mg total) by mouth at bedtime as needed. 20 tablet Lacara Dunsworth R, NP      PDMP not reviewed this encounter.   Teresa Shelba SAUNDERS, NP 02/28/24 1739

## 2024-02-28 NOTE — Discharge Instructions (Addendum)
 X-rays pending and you will be notified of the results via telephone  Begin prednisone  every morning with food to help reduce inflammation and pain, avoid ibuprofen while taking but may use Tylenol or any topical medicine  May use muscle relaxant at bedtime as needed for comfort, please be mindful this make you feel drowsy  You may use heating pad in 15 minute intervals as needed for additional comfort,or  you may find comfort in using ice in 10-15 minutes over affected area  Begin stretching affected area daily for 10 minutes as tolerated to further loosen muscles   When lying down place pillow underneath the arm and behind back  Please schedule follow-up appoint with your primary doctor or orthopedic specialist listed in front page

## 2024-02-29 ENCOUNTER — Ambulatory Visit (HOSPITAL_COMMUNITY): Payer: Self-pay
